# Patient Record
Sex: Male | Born: 1960 | Race: White | Hispanic: No | Marital: Single | Smoking: Never smoker
Health system: Southern US, Community
[De-identification: ages and names within clinical notes are randomized; demographics above are authoritative.]

## PROBLEM LIST (undated history)

## (undated) DIAGNOSIS — F419 Anxiety disorder, unspecified: Secondary | ICD-10-CM

## (undated) HISTORY — DX: Anxiety disorder, unspecified: F41.9

## (undated) HISTORY — PX: ELBOW SURGERY: SHX618

---

## 2006-11-08 ENCOUNTER — Ambulatory Visit (HOSPITAL_BASED_OUTPATIENT_CLINIC_OR_DEPARTMENT_OTHER): Admission: RE | Admit: 2006-11-08 | Discharge: 2006-11-08 | Payer: Self-pay | Admitting: Otolaryngology

## 2006-11-20 ENCOUNTER — Ambulatory Visit: Payer: Self-pay | Admitting: Internal Medicine

## 2006-12-30 ENCOUNTER — Ambulatory Visit: Payer: Self-pay | Admitting: Internal Medicine

## 2007-01-20 ENCOUNTER — Ambulatory Visit: Payer: Self-pay | Admitting: Internal Medicine

## 2007-07-19 ENCOUNTER — Ambulatory Visit: Payer: Self-pay | Admitting: Internal Medicine

## 2008-07-26 ENCOUNTER — Ambulatory Visit: Payer: Self-pay | Admitting: Internal Medicine

## 2008-07-26 DIAGNOSIS — G4730 Sleep apnea, unspecified: Secondary | ICD-10-CM | POA: Insufficient documentation

## 2008-07-26 DIAGNOSIS — J309 Allergic rhinitis, unspecified: Secondary | ICD-10-CM | POA: Insufficient documentation

## 2008-08-08 ENCOUNTER — Encounter: Admission: RE | Admit: 2008-08-08 | Discharge: 2008-08-08 | Payer: Self-pay | Admitting: Orthopedic Surgery

## 2008-08-14 ENCOUNTER — Encounter: Payer: Self-pay | Admitting: Internal Medicine

## 2009-05-27 ENCOUNTER — Emergency Department (HOSPITAL_COMMUNITY): Admission: EM | Admit: 2009-05-27 | Discharge: 2009-05-27 | Payer: Self-pay | Admitting: Emergency Medicine

## 2009-11-14 IMAGING — CR DG CHEST 2V
3 series · 3 of 3 positions shown · non-contrast
Comparison: None

CLINICAL DATA: Back pain

CHEST - 2 VIEW

[view not recorded (1 of 3)]
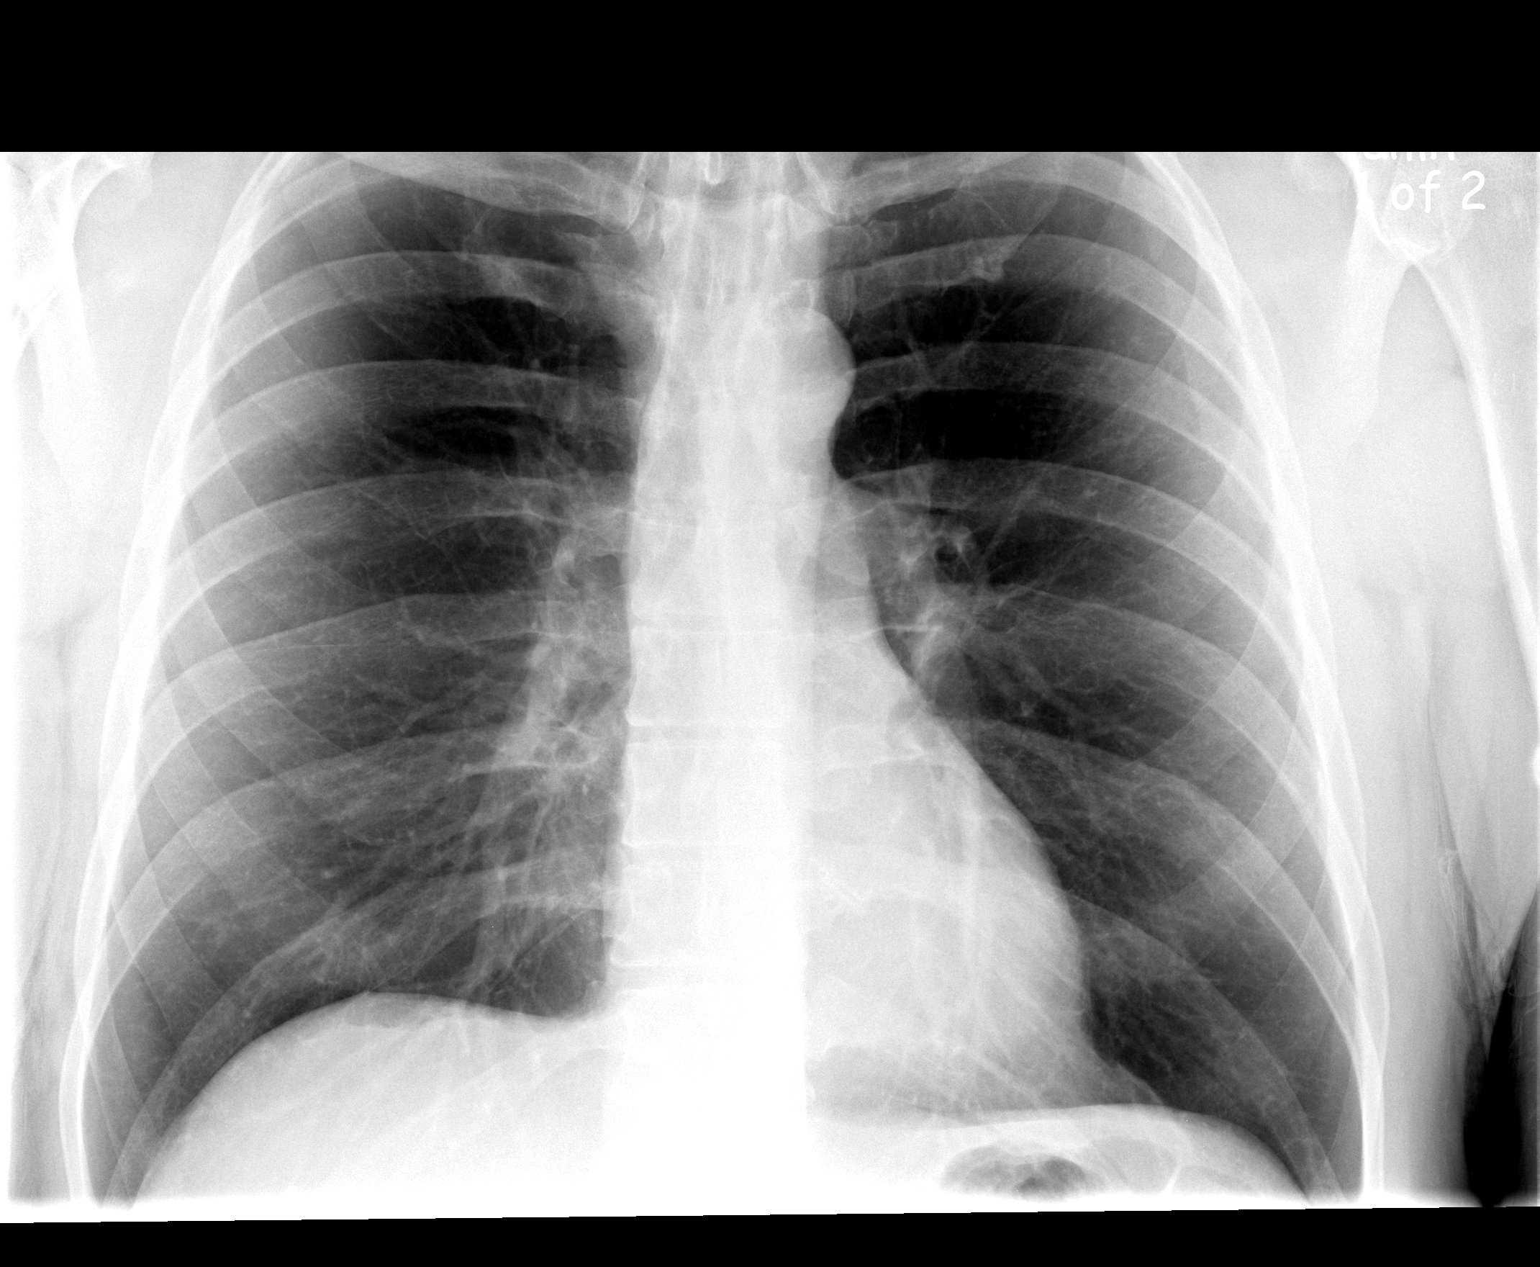

[view not recorded (2 of 3)]
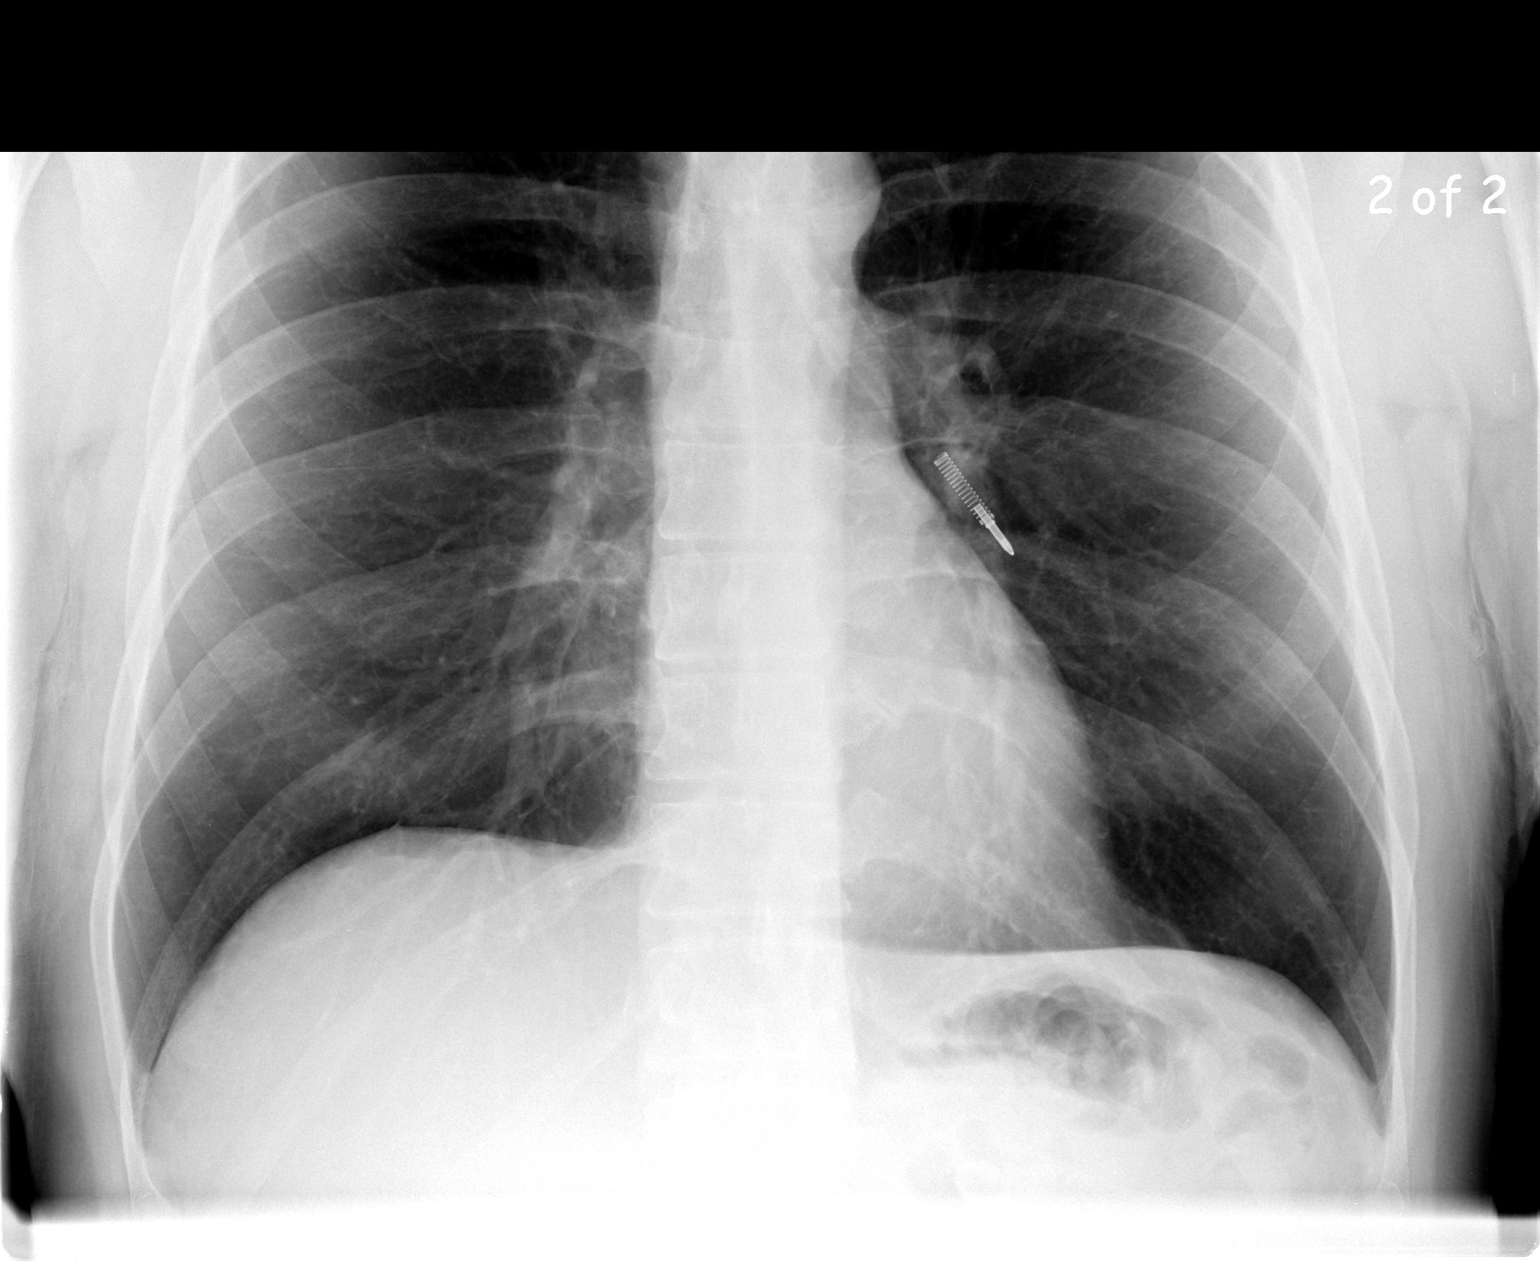

[view not recorded (3 of 3)]
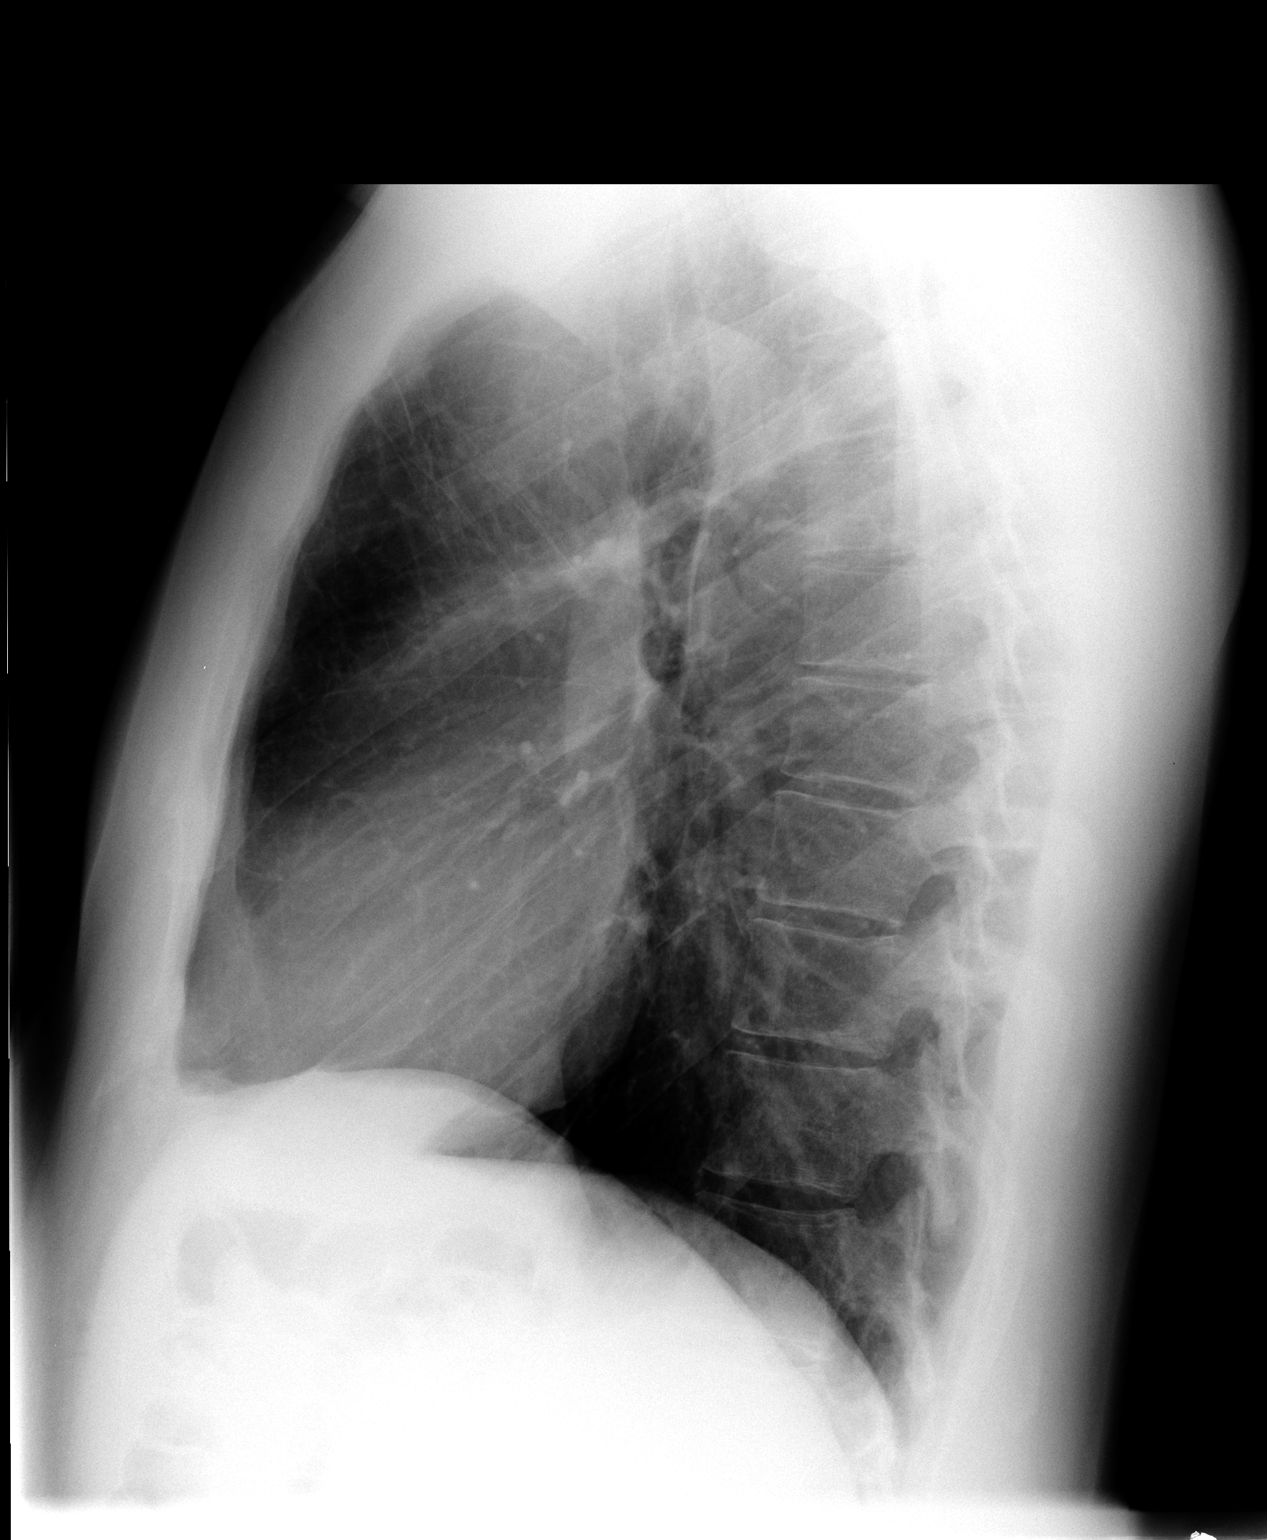

[3 of 3 positions shown; findings below may reference images not displayed]

FINDINGS: The heart size and mediastinal contours are within
normal limits.  Both lungs are clear.  The visualized skeletal
structures are unremarkable.
IMPRESSION: No active cardiopulmonary disease.

## 2011-02-28 LAB — POCT URINALYSIS DIP (DEVICE)
Bilirubin Urine: NEGATIVE
Glucose, UA: NEGATIVE mg/dL
Ketones, ur: NEGATIVE mg/dL

## 2011-04-06 NOTE — Assessment & Plan Note (Signed)
[facility]                             PULMONARY OFFICE NOTE   NAME:[name] [name] SCHINKE                         MRN:          [id number]  DATE:07/19/2007                            DOB:          [birth year]    PROBLEMS:  1. Obstructive sleep apnea.  2. Allergic rhinitis.   HISTORY OF PRESENT ILLNESS:  He is very pleased with auto titration  CPAP.  I have not gotten the download report, but he does not want to  change.  There has been no increase in nasal congestion which he says is  chronic.  Nasal sprays do help, but he dropped off of Nasacort.  No  seasonal concern now.  He is satisfied with his current status.   MEDICATIONS:  1. Paxil 30 mg.  2. Xanax 1 mg.  3. CPAP auto titration.  4. Benadryl p.r.n., used occasionally, we have discussed nonsedating      alternatives.   ALLERGIES:  INTOLERANCE TO PENICILLIN.   OBJECTIVE:  Weight 238 pounds, blood pressure 130/80, pulse 83, room air  saturation 96%, mild nasal stuffiness, periorbital edema, turbinate  edema, clear nasal secretion.  Chest clear.   IMPRESSION:  Rhinitis and some septal deviation which he says do not  bother him.  Obstructive sleep apnea, well controlled.   PLAN:  1. Continue CPAP by auto titration at his preference.  2. He will follow up with ENT p.r.n. having had seen Dr.  Annalee [name] in      the past.  Recognizing that mechanical obstruction by his septal      deviation is part of the problem.  There is also an allergic      rhinitis by appearance, but he does not want to do anything more      about it.  I have offered nasal steroid sprays.  Schedule return in      one year, earlier p.r.n.     Clinton D. Maple Hudson, MD, Tonny Bollman, FACP  Electronically Signed    CDY/MedQ  DD: 07/24/2007  DT: 07/24/2007  Job #: [id number]   cc:   Holley Bouche, M.D.

## 2011-04-09 NOTE — Assessment & Plan Note (Signed)
[facility]                             PULMONARY OFFICE NOTE   NAME:PAGEDominick, Morella                         MRN:          [id number]  DATE:01/20/2007                            DOB:          March 12, 1961    PROBLEMS:  1. Obstructive sleep apnea.  2. Allergic rhinitis.   HISTORY:  He has been on auto-titration CPAP, stating he is definitely  sleeping better and going in the right direction.  He has no history  of seasonal rhinitis, but says recent aggravation of nasal congestion  has remained much better after a Depo-Medrol injection.   MEDICATIONS:  1. Paxil 30 mg.  2. Xanax 1 mg.  3. CPAP.  4. He had been using some Benadryl p.r.n.   DRUG INTOLERANCES:  PENICILLIN.   OBJECTIVE:  Weight 232 pounds, BP 130/72, pulse regular 69, room air  saturation 99%.  Mild nasal stuffiness is apparent on listening to him talk.  There is  bilateral periorbital edema with no conjunctival injection.  He has a  pressure mark on the bridge of his nose from his mask.  LUNGS:  Clear.  Chest normal.   IMPRESSION:  1. Obstructive sleep apnea, should respond nicely to continuous      positive airway pressure.  He is noticing a significant improvement      in the quality of his sleep and sense of daytime tiredness.  2. There is a persistent rhinitis.  I am not sure he has felt much      better than this to recognize what might be normal.   PLAN:  1. We are going to refill and continue Nasacort.  2. We will convert his CPAP to fixed pressure.  3. Schedule return in 4 months, but earlier p.r.n.     Clinton D. Maple Hudson, MD, Tonny Bollman, FACP  Electronically Signed   CDY/MedQ  DD: 01/21/2007  DT: 01/21/2007  Job #: [id number]   cc:   Holley Bouche, M.D.  Kinnie Scales. Annalee Genta, M.D.

## 2011-04-09 NOTE — Assessment & Plan Note (Signed)
[facility]                             PULMONARY OFFICE NOTE   NAME:[name], [name]                         MRN:          [id number]  DATE:12/30/2006                            DOB:          1961-06-10    PROBLEM:  Sleep medicine consultation at the kind request of Dr.  Annalee [name] for this 50 year old man with sleep apnea.   HISTORY:  He has been concerned about non-restorative sleep, needing  naps most afternoons.  For years he had been waking as tired as he went  to bed, and getting very drowsy if she sat quietly.  He has been told of  loud snoring.  Breathe Right Strips would help nasal airflow some.  Recently, he has experienced periorbital edema and sneezing, which are  new for him.  He had not thought that nasal congestion was contributing  to snoring in the past.  He has gained some weight.  A nocturnal  polysomnogram was done at the [facility] on November 08, 2006,  significant for difficulty maintaining sleep.  He had severe obstructive  apnea during limited sleep time with an index of 63 per hour non-  positional events, loud snoring and oxygen desaturation to 87%.  He has  discussed treatment options with Dr. Annalee [name], who recommended that he  be seen for my evaluation.  Bedtime is between 10:30 and 11, estimating  30 minutes sleep onset before getting up at 7 a.m.  He is not sure how  much he wakes up during the night.   REVIEW OF SYSTEMS:  Nasal congestion and periorbital edema only in the  past 3 weeks.  Benadryl seems to help.  A 20-pound weight gain in the  last year or 2.  He sleeps alone.  Occasional awareness of leg or body  jerks, choking at night, but not significant reflux.  No chest pain or  palpitation.  No daytime chest congestion, wheeze or cough.  No ankle  edema.  No headache.  Nothing purulent or bloody.   PAST HISTORY:  No ENT surgery including tonsils.  No prior allergy  history.  No surgeries.  He is unaware of any  medical problems.   SOCIAL HISTORY:  Not married.  No children.  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  Alcohol about 4 times per week.   FAMILY HISTORY:  Mother snores.  Nobody known to have sleep apnea or  lung disease.  A brother has allergy problems.   OBJECTIVE:  Tall man, 228 pounds.  BP 122/80.  Pulse regular at 62.  Room air saturation 99% on room air at rest.  He is alert.  There is bilateral periorbital edema without conjunctival injection.  Nasal congestion is evident during speech with marked turbinate edema,  clear, watery, bubbly secretion.  Mucosa is not erythematous.  LUNGS:  Clear.  HEART SOUNDS:  Regular without murmur.  Speech quality is normal.  Thyroid is not enlarged.   IMPRESSION:  1. Obstructive sleep apnea.  2. Rhinitis with question of rhinosinusitis.  It is unusual to have  onset of allergic rhinitis so abruptly, and I question whether he      has had some other basis for nasal congestion, but this process      seems much more recent than onset of his sleep complaints.   PLAN:  1. We have discussed the physiology and medical concerns of sleep      apnea, possible relation to nasal congestion and treatment options.  2. We are going to titrate him for CPAP trial.  3. He is given a Neo-Synephrine nasal inhalation treatment, Nasacort      AQ to start 2 sprays each nostril once daily.  4. Depo-Medrol 80 mg IM with steroid talk.  5. Schedule return in 3 weeks with question of whether he will need      more therapy addressing his nasal congestion, and we may need to re-      involve Dr. Annalee [name] if there is mechanical obstruction. I      appreciate the chance the meet him.     [name] D. Maple Hudson, MD, [name] [name], FACP  Electronically Signed    CDY/MedQ  DD: 12/31/2006  DT: 12/31/2006  Job #: [id number]   cc:   [name] [name]. [name] [name], M.D.  [name] [name], M.D.

## 2011-04-09 NOTE — Procedures (Signed)
NAME:  PAGECher, [name] [name]                  ACCOUNT NO.:  [id number]   MEDICAL RECORD NO.:  [id number]          PATIENT TYPE:  OUT   LOCATION:  SLEEP CENTER                 FACILITY[facility]  [facility]   PHYSICIAN:  Clinton D. Maple Hudson, MD, FCCP, FACPDATE OF BIRTH:  10-[birth year]-63   DATE OF STUDY:  11/08/2006                            NOCTURNAL POLYSOMNOGRAM   INDICATION FOR STUDY:  Hypersomnia with sleep apnea.   EPWORTH SLEEPINESS SCORE:  8/24.   BMI:  27.9.   WEIGHT:  230 pounds.   HOME MEDICATION:  Paxil.   MEDICATIONS:   SLEEP ARCHITECTURE:  Very short total sleep time 91.5 minutes with sleep  efficiency 22%.  Sleep onset was delayed until 1:41 a.m.  Stage I was  21%, stage II 79% of total sleep time, stages III and IV, and REM were  absent.  Sleep latency 217 minutes.  Awake after sleep onset 114  minutes, reflecting minimal sleep after 3 a.m.  Arousal index 35.4.  No  bedtime medication was taken.  The patient reported that he had taken a  nap on the day of the study despite instruction otherwise.   RESPIRATORY DATA:  Apnea-hypopnea index (AHI, RDI) 63 obstructive events  per hour during the short total sleep time recorded.  There were 76  apneas and 20 hypopneas.  Events were not positional.  Split study  protocol could not be followed because of insufficient sleep time and  delay in sleep onset.   OXYGEN DATA:  Very severe snoring with oxygen desaturation to a nadir of  87%.  Mean oxygen saturation through the study was 94% on room air.   CARDIAC DATA:  Normal sinus rhythm with PVCs.   MOVEMENT-PARASOMNIA:  Occasional limb jerk without significant arousal.   IMPRESSIONS-RECOMMENDATIONS:  1. Abnormal sleep architecture with marked delay in sleep onset and      inability to sustain sleep through the night.  2. Severe obstructive sleep apnea/hypopnea syndrome was recorded      during the limited sleep time, with apnea-hypopnea index 63 per      hour, non-positional events, and very loud  snoring.  Oxygen      desaturation to a nadir of      87%.  3. Consider return for CPAP titration with an appropriate sleep      medication if __________.      Clinton D. Maple Hudson, MD, [facility], FACP  Diplomate, Biomedical engineer of Sleep Medicine  Electronically Signed     CDY/MEDQ  D:  11/20/2006 10:29:59  T:  11/20/2006 17:39:47  Job:  [id number]

## 2014-01-08 ENCOUNTER — Ambulatory Visit (INDEPENDENT_AMBULATORY_CARE_PROVIDER_SITE_OTHER): Payer: BC Managed Care – PPO | Admitting: Emergency Medicine

## 2014-01-08 VITALS — BP 130/78 | HR 78 | Temp 98.3°F | Resp 16 | Ht 75.25 in | Wt 229.2 lb

## 2014-01-08 DIAGNOSIS — K044 Acute apical periodontitis of pulpal origin: Secondary | ICD-10-CM

## 2014-01-08 DIAGNOSIS — K047 Periapical abscess without sinus: Secondary | ICD-10-CM

## 2014-01-08 MED ORDER — CLINDAMYCIN HCL 150 MG PO CAPS: 150.0000 mg | ORAL_CAPSULE | Freq: Three times a day (TID) | ORAL | Status: DC

## 2014-01-08 MED ORDER — CEPHALEXIN 500 MG PO CAPS: 500.0000 mg | ORAL_CAPSULE | Freq: Three times a day (TID) | ORAL | Status: DC

## 2014-01-08 MED ORDER — ACETAMINOPHEN-CODEINE #3 300-30 MG PO TABS: 1.0000 | ORAL_TABLET | ORAL | Status: DC | PRN

## 2014-01-08 NOTE — Patient Instructions (Signed)
°  Dental Abscess °A dental abscess is a collection of infected fluid (pus) from a bacterial infection in the inner part of the tooth (pulp). It usually occurs at the end of the tooth's root.  °CAUSES  °· Severe tooth decay. °· Trauma to the tooth that allows bacteria to enter into the pulp, such as a broken or chipped tooth. °SYMPTOMS  °· Severe pain in and around the infected tooth. °· Swelling and redness around the abscessed tooth or in the mouth or face. °· Tenderness. °· Pus drainage. °· Bad breath. °· Bitter taste in the mouth. °· Difficulty swallowing. °· Difficulty opening the mouth. °· Nausea. °· Vomiting. °· Chills. °· Swollen neck glands. °DIAGNOSIS  °· A medical and dental history will be taken. °· An examination will be performed by tapping on the abscessed tooth. °· X-rays may be taken of the tooth to identify the abscess. °TREATMENT °The goal of treatment is to eliminate the infection. You may be prescribed antibiotic medicine to stop the infection from spreading. A root canal may be performed to save the tooth. If the tooth cannot be saved, it may be pulled (extracted) and the abscess may be drained.  °HOME CARE INSTRUCTIONS °· Only take over-the-counter or prescription medicines for pain, fever, or discomfort as directed by your caregiver. °· Rinse your mouth (gargle) often with salt water (¼ tsp salt in 8 oz [250 ml] of warm water) to relieve pain or swelling. °· Do not drive after taking pain medicine (narcotics). °· Do not apply heat to the outside of your face. °· Return to your dentist for further treatment as directed. °SEEK MEDICAL CARE IF: °· Your pain is not helped by medicine. °· Your pain is getting worse instead of better. °SEEK IMMEDIATE MEDICAL CARE IF: °· You have a fever or persistent symptoms for more than 2 3 days. °· You have a fever and your symptoms suddenly get worse. °· You have chills or a very bad headache. °· You have problems breathing or swallowing. °· You have trouble  opening your mouth. °· You have swelling in the neck or around the eye. °Document Released: 11/08/2005 Document Revised: 08/02/2012 Document Reviewed: 02/16/2011 °[employer]® Patient Information ©2014 [employer]. ° ° °

## 2014-01-08 NOTE — Progress Notes (Signed)
[facility] [address], [place] [id number] [phone]  Date:  01/08/2014   Name:  [name] [name]   DOB:  05/19/1961   MRN:  [id number]  PCP:  Pcp Not In System    Chief Complaint: Tooth Infection   History of Present Illness:  [name] [name] is a 53 y.o. very pleasant male patient who presents with the following:  Says a tooth lost a filling two weeks ago and now has pain in that tooth and was unable to see a dentist today.  No fever or chills or facial swelling.  Thermal sensitive No improvement with over the counter medications or other home remedies. Denies other complaint or health concern today.   Patient Active Problem List   Diagnosis Date Noted  . ALLERGIC RHINITIS 07/26/2008  . SLEEP APNEA 07/26/2008    Past Medical History  Diagnosis Date  . Anxiety     Past Surgical History  Procedure Laterality Date  . Elbow surgery      History  Substance Use Topics  . Smoking status: Never Smoker   . Smokeless tobacco: Not on file  . Alcohol Use: Yes    Family History  Problem Relation Age of Onset  . Hyperlipidemia Mother   . Hyperlipidemia Father   . Heart disease Father   . Cancer Father     Allergies  Allergen Reactions  . Penicillins     REACTION: hives,redness    Medication list has been reviewed and updated.  No current outpatient prescriptions on file prior to visit.   No current facility-administered medications on file prior to visit.    Review of Systems:  As per HPI, otherwise negative.    Physical Examination: Filed Vitals:   01/08/14 1637  BP: 130/78  Pulse: 78  Temp: 98.3 F (36.8 C)  Resp: 16   Filed Vitals:   01/08/14 1637  Height: 6' 3.25" (1.911 m)  Weight: 229 lb 3.2 oz (103.964 kg)   Body mass index is 28.47 kg/(m^2). Ideal Body Weight: Weight in (lb) to have BMI = 25: 200.9   GEN: WDWN, NAD, Non-toxic, Alert & Oriented x 3 HEENT: Atraumatic, Normocephalic.  Ears and Nose: No external  deformity. EXTR: No clubbing/cyanosis/edema NEURO: Normal gait.  PSYCH: Normally interactive. Conversant. Not depressed or anxious appearing.  Calm demeanor.  MOUTH:  Lost filling #15.  Tender. No abscess  Assessment and Plan: Dental infection Keflex Clindamycin Tylenol #3 Dentist  Signed,  Phillips OdorJeffery Vicci Reder, MD

## 2014-06-06 ENCOUNTER — Ambulatory Visit (INDEPENDENT_AMBULATORY_CARE_PROVIDER_SITE_OTHER): Payer: BC Managed Care – PPO | Admitting: Family Medicine

## 2014-06-06 VITALS — BP 131/87 | HR 88 | Temp 98.6°F | Resp 16

## 2014-06-06 DIAGNOSIS — M109 Gout, unspecified: Secondary | ICD-10-CM

## 2014-06-06 LAB — CBC WITH DIFFERENTIAL/PLATELET
BASOS ABS: 0.1 10*3/uL (ref 0.0–0.1)
Basophils Relative: 1 % (ref 0–1)
EOS ABS: 0.2 10*3/uL (ref 0.0–0.7)
EOS PCT: 3 % (ref 0–5)
HEMATOCRIT: 43.5 % (ref 39.0–52.0)
Hemoglobin: 15.6 g/dL (ref 13.0–17.0)
LYMPHS PCT: 20 % (ref 12–46)
Lymphs Abs: 1.4 10*3/uL (ref 0.7–4.0)
MCH: 30.9 pg (ref 26.0–34.0)
MCHC: 35.9 g/dL (ref 30.0–36.0)
MCV: 86.1 fL (ref 78.0–100.0)
MONO ABS: 0.7 10*3/uL (ref 0.1–1.0)
Monocytes Relative: 10 % (ref 3–12)
Neutro Abs: 4.6 10*3/uL (ref 1.7–7.7)
Neutrophils Relative %: 66 % (ref 43–77)
PLATELETS: 169 10*3/uL (ref 150–400)
RBC: 5.05 MIL/uL (ref 4.22–5.81)
RDW: 12.9 % (ref 11.5–15.5)
WBC: 7 10*3/uL (ref 4.0–10.5)

## 2014-06-06 LAB — URIC ACID: Uric Acid, Serum: 6.3 mg/dL (ref 4.0–7.8)

## 2014-06-06 LAB — POCT SEDIMENTATION RATE: POCT SED RATE: 29 mm/hr — AB (ref 0–22)

## 2014-06-06 MED ORDER — METHYLPREDNISOLONE SODIUM SUCC 125 MG IJ SOLR
125.0000 mg | Freq: Once | INTRAMUSCULAR | Status: AC
  Administered 2014-06-06: 125 mg via INTRAMUSCULAR

## 2014-06-06 MED ORDER — OXYCODONE-ACETAMINOPHEN 5-325 MG PO TABS: 1.0000 | ORAL_TABLET | Freq: Three times a day (TID) | ORAL | Status: AC | PRN

## 2014-06-06 MED ORDER — KETOROLAC TROMETHAMINE 60 MG/2ML IM SOLN
60.0000 mg | Freq: Once | INTRAMUSCULAR | Status: AC
  Administered 2014-06-06: 60 mg via INTRAMUSCULAR

## 2014-06-06 MED ORDER — PREDNISONE 10 MG PO TABS: ORAL_TABLET | ORAL | Status: DC

## 2014-06-06 MED ORDER — COLCHICINE 0.6 MG PO TABS: ORAL_TABLET | ORAL | Status: DC

## 2014-06-06 NOTE — Patient Instructions (Signed)
Gout Gout is an inflammatory arthritis caused by a buildup of uric acid crystals in the joints. Uric acid is a chemical that is normally present in the blood. When the level of uric acid in the blood is too high it can form crystals that deposit in your joints and tissues. This causes joint redness, soreness, and swelling (inflammation). Repeat attacks are common. Over time, uric acid crystals can form into masses (tophi) near a joint, destroying bone and causing disfigurement. Gout is treatable and often preventable. CAUSES  The disease begins with elevated levels of uric acid in the blood. Uric acid is produced by your body when it breaks down a naturally found substance called purines. Certain foods you eat, such as meats and fish, contain high amounts of purines. Causes of an elevated uric acid level include:  Being passed down from parent to child (heredity).  Diseases that cause increased uric acid production (such as obesity, psoriasis, and certain cancers).  Excessive alcohol use.  Diet, especially diets rich in meat and seafood.  Medicines, including certain cancer-fighting medicines (chemotherapy), water pills (diuretics), and aspirin.  Chronic kidney disease. The kidneys are no longer able to remove uric acid well.  Problems with metabolism. Conditions strongly associated with gout include:  Obesity.  High blood pressure.  High cholesterol.  Diabetes. Not everyone with elevated uric acid levels gets gout. It is not understood why some people get gout and others do not. Surgery, joint injury, and eating too much of certain foods are some of the factors that can lead to gout attacks. SYMPTOMS   An attack of gout comes on quickly. It causes intense pain with redness, swelling, and warmth in a joint.  Fever can occur.  Often, only one joint is involved. Certain joints are more commonly involved:  Base of the big toe.  Knee.  Ankle.  Wrist.  Finger. Without  treatment, an attack usually goes away in a few days to weeks. Between attacks, you usually will not have symptoms, which is different from many other forms of arthritis. DIAGNOSIS  Your caregiver will suspect gout based on your symptoms and exam. In some cases, tests may be recommended. The tests may include:  Blood tests.  Urine tests.  X-rays.  Joint fluid exam. This exam requires a needle to remove fluid from the joint (arthrocentesis). Using a microscope, gout is confirmed when uric acid crystals are seen in the joint fluid. TREATMENT  There are two phases to gout treatment: treating the sudden onset (acute) attack and preventing attacks (prophylaxis).  Treatment of an Acute Attack.  Medicines are used. These include anti-inflammatory medicines or steroid medicines.  An injection of steroid medicine into the affected joint is sometimes necessary.  The painful joint is rested. Movement can worsen the arthritis.  You may use warm or cold treatments on painful joints, depending which works best for you.  Treatment to Prevent Attacks.  If you suffer from frequent gout attacks, your caregiver may advise preventive medicine. These medicines are started after the acute attack subsides. These medicines either help your kidneys eliminate uric acid from your body or decrease your uric acid production. You may need to stay on these medicines for a very long time.  The early phase of treatment with preventive medicine can be associated with an increase in acute gout attacks. For this reason, during the first few months of treatment, your caregiver may also advise you to take medicines usually used for acute gout treatment. Be sure you   understand your caregiver's directions. Your caregiver may make several adjustments to your medicine dose before these medicines are effective.  Discuss dietary treatment with your caregiver or dietitian. Alcohol and drinks high in sugar and fructose and foods  such as meat, poultry, and seafood can increase uric acid levels. Your caregiver or dietician can advise you on drinks and foods that should be limited. HOME CARE INSTRUCTIONS   Do not take aspirin to relieve pain. This raises uric acid levels.  Only take over-the-counter or prescription medicines for pain, discomfort, or fever as directed by your caregiver.  Rest the joint as much as possible. When in bed, keep sheets and blankets off painful areas.  Keep the affected joint raised (elevated).  Apply warm or cold treatments to painful joints. Use of warm or cold treatments depends on which works best for you.  Use crutches if the painful joint is in your leg.  Drink enough fluids to keep your urine clear or pale yellow. This helps your body get rid of uric acid. Limit alcohol, sugary drinks, and fructose drinks.  Follow your dietary instructions. Pay careful attention to the amount of protein you eat. Your daily diet should emphasize fruits, vegetables, whole grains, and fat-free or low-fat milk products. Discuss the use of coffee, vitamin C, and cherries with your caregiver or dietician. These may be helpful in lowering uric acid levels.  Maintain a healthy body weight. SEEK MEDICAL CARE IF:   You develop diarrhea, vomiting, or any side effects from medicines.  You do not feel better in 24 hours, or you are getting worse. SEEK IMMEDIATE MEDICAL CARE IF:   Your joint becomes suddenly more tender, and you have chills or a fever. MAKE SURE YOU:   Understand these instructions.  Will watch your condition.  Will get help right away if you are not doing well or get worse. Document Released: 11/05/2000 Document Revised: 03/05/2013 Document Reviewed: 06/21/2012 [employer] Patient Information 2015 [employer]. This information is not intended to replace advice given to you by your health care provider. Make sure you discuss any questions you have with your health care  provider. Information for patients with Gout  Gout defined-Gout occurs when urate crystals accumulate in your joint causing the inflammation and intense pain of gout attack.  Urate crystals can form when you have high levels of uric acid in your blood.  Your body produces uric acid when it breaks down prurines-substances that are found naturally in your body, as well as in certain foods such as organ meats, anchioves, herring, asparagus, and mushrooms.  Normally uric acid dissolves in your blood and passes through your kidneys into your urine.  But sometimes your body either produces too much uric acid or your kidneys excrete too little uric acid.  When this happens, uric acid can build up, forming sharp needle-like urate crystals in a joint or surrounding tissue that cause pain, inflammation and swelling.    Gout is characterized by sudden, severe attacks of pain, redness and tenderness in joints, often the joint at the base of the big toe.  Gout is complex form of arthritis that can affect anyone.  Men are more likely to get gout but women become increasingly more susceptible to gout after menopause.  An acute attack of gout can wake you up in the middle of the night with the sensation that your big toe is on fire.  The affected joint is hot, swollen and so tender that even the weight or the   sheet on it may seem intolerable.  If you experience symptoms of an acute gout attack it is important to your doctor as soon as the symptoms start.  Gout that goes untreated can lead to worsening pain and joint damage.  Risk Factors:  You are more likely to develop gout if you have high levels of uric acid in your body.    Factors that increase the uric acid level in your body include:  Lifestyle factors.  Excessive alcohol use-generally more than two drinks a day for men and more than one for women increase the risk of gout.  Medical conditions.  Certain conditions make it more likely that you will  develop gout.  These include hypertension, and chronic conditions such as diabetes, high levels of fat and cholesterol in the blood, and narrowing of the arteries.  Certain medications.  The uses of Thiazide diuretics- commonly used to treat hypertension and low dose aspirin can also increase uric acid levels.  Family history of gout.  If other members of your family have had gout, you are more likely to develop the disease.  Age and sex. Gout occurs more often in men than it does in women, primarily because women tend to have lower uric acid levels than men do.  Men are more likely to develop gout earlier usually between the ages of 40-50- whereas women generally develop signs and symptoms after menopause.    Tests and diagnosis:  Tests to help diagnose gout may include:  Blood test.  Your doctor may recommend a blood test to measure the uric acid level in your blood .  Blood tests can be misleading, though.  Some people have high uric acid levels but never experience gout.  And some people have signs and symptoms of gout, but don't have unusual levels of uric acid in their blood.  Joint fluid test.  Your doctor may use a needle to draw fluid from your affected joint.  When examined under the microscope, your joint fluid may reveal urate crystals.  Treatment:  Treatment for gout usually involves medications.  What medications you and your doctor choose will be based on your current health and other medications you currently take.  Gout medications can be used to treat acute gout attacks and prevent future attacks as well as reduce your risk of complications from gout such as the development of tophi from urate crystal deposits.  Alternative medicine:   Certain foods have been studied for their potential to lower uric acid levels, including:  Coffee.  Studies have found an association between coffee drinking (regular and decaf) and lower uric acid levels.  The evidence is not enough to  encourage non-coffee drinkers to start, but it may give clues to new ways of treating gout in the future.  Vitamin C.  Supplements containing vitamin C may reduce the levels of uric acid in your blood.  However, vitamin as a treatment for gout. Don't assume that if a little vitamin C is good, than lots is better.  Megadoses of vitamin C may increase your bodies uric acid levels.  Cherries.  Cherries have been associated with lower levels of uric acid in studies, but it isn't clear if they have any effect on gout signs and symptoms.  Eating more cherries and other dar-colored fruits, such as blackberries, blueberries, purple grapes and raspberries, may be a safe way to support your gout treatment.    Lifestyle/Diet Recommendations:   Drink 8 to 16 cups ( about 2   to 4 liters) of fluid each day, with at least half being water.  Avoid alcohol  Eat a moderate amount of protein, preferably from healthy sources, such as low-fat or fat-free dairy, tofu, eggs, and nut butters.  Limit you daily intake of meat, fish, and poultry to 4 to 6 ounces.  Avoid high fat meats and desserts.  Decrease you intake of shellfish, beef, lamb, pork, eggs and cheese.  Choose a good source of vitamin C daily such as citrus fruits, strawberries, broccoli,  brussel sprouts, papaya, and cantaloupe.   Choose a good source of vitamin A every other day such as yellow fruits, or dark green/yellow vegetables.  Avoid drastic weigh reduction or fasting.  If weigh loss is desired lose it over a period of several months.  See "dietary considerations.." chart for specific food recommendations.  Dietary Considerations for people with Gout  Food with negligible purine content (0-15 mg of purine nitrogen per 100 grams food)  May use as desired except on calorie variations  Non fat milk Cocoa Cereals (except in list II) Hard candies  Buttermilk Carbonated drinks Vegetables (except in list II) Sherbet  Coffee Fruits Sugar Honey   Tea Cottage Cheese Gelatin-jell-o Salt  Fruit juice Breads Angel food Cake   Herbs/spices Jams/Jellies Carnation Instant Breakfast    Foods that do not contain excessive purine content, but must be limited due to fat content  Cream Eggs Oil and Salad Dressing  Half and Half Peanut Butter Chocolate  Whole Milk Cakes Potato Chips  Butter Ice Cream Fried Foods  Cheese Nuts Waffles, pancakes   List II: Food with moderate purine content (50-150 mg of purine nitrogen per 100 grams of food)  Limit total amount each day to 5 oz. cooked Lean meat, other than those on list III   Poultry, other than those on list III Fish, other than those on list III   Seafood, other than those on list III  These foods may be used occasionally  Peas Lentils Bran  Spinach Oatmeal Dried Beans and Peas  Asparagus Wheat Germ Mushrooms   Additional information about meat choices  Choose fish and poultry, particularly without skin, often.  Select lean, well trimmed cuts of meat.  Avoid all fatty meats, bacon , sausage, fried meats, fried fish, or poultry, luncheon meats, cold cuts, hot dogs, meats canned or frozen in gravy, spareribs and frozen and packaged prepared meats.   List III: Foods with HIGH purine content / Foods to AVOID (150-800 mg of purine nitrogen per 100 grams of food)  Anchovies Herring Meat Broths  Liver Mackerel Meat Extracts  Kidney Scallops Meat Drippings  Sardines Wild Game Mincemeat  Sweetbreads Goose Gravy  Heart Tongue Yeast, baker's and brewers   Commercial soups made with any of the foods listed in List II or List III  In addition avoid all alcoholic beverages 

## 2014-06-06 NOTE — Progress Notes (Signed)
Subjective:  This chart was scribed for Norberto SorensonEva Kent Riendeau, MD by Charline BillsEssence Howell, ED Scribe. The patient was seen in room 4. Patient's care was started at 10:14 AM.   Patient ID: [name] GitelmanJames H [name], male    DOB: 08/12/1961, 53 y.o.   MRN: [id number]  Chief Complaint  Patient presents with  . Gout    lt ankle gout attack x 3 days   HPI HPI Comments: [name] [name] is a 53 y.o. male who presents to the [facility] complaining of constant R ankle pain onset 3 days ago. He describes the quality of pain as throbbing. Pt reports associated swelling to the area. He states that he has slept with his foot off the bed for days since he can not tolerate bed sheets. He denies injury. Pt suspects gout and reports first episode 3 months ago in L toe. Pt has a family h/o gout. He has placed his foot in ice water, eaten cherries and taken Advil with temporary relief. Pt reports a diet that consists largely of beer and beef.   Past Medical History  Diagnosis Date  . Anxiety    Current Outpatient Prescriptions on File Prior to Visit  Medication Sig Dispense Refill  . PARoxetine (PAXIL) 20 MG tablet Take 20 mg by mouth daily.       No current facility-administered medications on file prior to visit.   Allergies  Allergen Reactions  . Penicillins     REACTION: hives,redness   Review of Systems  Constitutional: Negative for fever, chills, diaphoresis and fatigue.  Eyes: Negative for visual disturbance.  Respiratory: Negative for cough, chest tightness, shortness of breath and wheezing.   Cardiovascular: Negative for chest pain and palpitations.  Gastrointestinal: Negative for nausea, vomiting, abdominal pain and diarrhea.  Musculoskeletal: Positive for arthralgias and joint swelling. Negative for neck pain and neck stiffness.  Skin: Negative for rash.  Neurological: Negative for dizziness, syncope, weakness, light-headedness, numbness and headaches.   Triage Vitals: BP 131/87  Pulse 88   Temp(Src) 98.6 F (37 C) (Oral)  Resp 16  SpO2 98%    Objective:   Physical Exam  Nursing note and vitals reviewed. Constitutional: He is oriented to person, place, and time. He appears well-developed and well-nourished.  HENT:  Head: Normocephalic and atraumatic.  Eyes: Conjunctivae and EOM are normal.  Neck: Neck supple.  Cardiovascular:  Pulses:      Dorsalis pedis pulses are 2+ on the right side, and 2+ on the left side.       Posterior tibial pulses are 2+ on the right side, and 2+ on the left side.  Musculoskeletal:       Right ankle: He exhibits decreased range of motion and swelling.  R ankle: Erythema Warmth Significant hypesthesia  Neurological: He is alert and oriented to person, place, and time.  Skin: Skin is warm and dry.  Psychiatric: He has a normal mood and affect. His behavior is normal.      Assessment & Plan:   Acute gout of right ankle, unspecified cause - Plan: Uric acid, CBC with Differential, POCT SEDIMENTATION RATE, ketorolac (TORADOL) injection 60 mg, methylPREDNISolone sodium succinate (SOLU-MEDROL) 125 mg/2 mL injection 125 mg  Meds ordered this encounter  Medications  . predniSONE (DELTASONE) 10 MG tablet    Sig: 6-5-4-3-2-1 tabs po qd x 6d taper    Dispense:  21 tablet    Refill:  0  . oxyCODONE-acetaminophen (ROXICET) 5-325 MG per tablet  Sig: Take 1 tablet by mouth every 8 (eight) hours as needed for severe pain.    Dispense:  20 tablet    Refill:  0  . colchicine 0.6 MG tablet    Sig: 2 tab po x 1 at onset of gout flair, then 1 tab 1 hr later    Dispense:  6 tablet    Refill:  2  . ketorolac (TORADOL) injection 60 mg    Sig:   . methylPREDNISolone sodium succinate (SOLU-MEDROL) 125 mg/2 mL injection 125 mg    Sig:     I personally performed the services described in this documentation, which was scribed in my presence. The recorded information has been reviewed and considered, and addended by me as needed.  Norberto Sorenson, MD  [facility]

## 2014-06-07 ENCOUNTER — Encounter: Payer: Self-pay | Admitting: Family Medicine

## 2014-10-30 ENCOUNTER — Ambulatory Visit (INDEPENDENT_AMBULATORY_CARE_PROVIDER_SITE_OTHER): Payer: BC Managed Care – PPO | Admitting: Family Medicine

## 2014-10-30 VITALS — BP 120/72 | HR 67 | Temp 98.3°F | Resp 18 | Ht 75.0 in | Wt 237.0 lb

## 2014-10-30 DIAGNOSIS — M10471 Other secondary gout, right ankle and foot: Secondary | ICD-10-CM

## 2014-10-30 DIAGNOSIS — M25571 Pain in right ankle and joints of right foot: Secondary | ICD-10-CM

## 2014-10-30 MED ORDER — PREDNISONE 10 MG PO TABS: ORAL_TABLET | ORAL | Status: AC

## 2014-10-30 MED ORDER — COLCHICINE 0.6 MG PO TABS: ORAL_TABLET | ORAL | Status: DC

## 2014-10-30 NOTE — Progress Notes (Signed)
Subjective: Patient had acute onset of pain in his right ankle last night. He has had gout in his left large toe not long ago. It resolved to colchicine and prednisone. He was hurting worse today and he came on in. Knows of no injury. His previous uric acid level was low. He did eat some shrimp several days ago.  Objective: Tender and swelling right ankle, especially lateral aspect. No erythema. Not very warm to touch. Hurts to pronate or supinate or flex the ankle.  Assessment: Acute gout and pain right ankle  Plan: Prednisone and colchicine Check labs in about a month. Sometimes during the acute flare the level will go down, so we will wait a little while to check it. Last time it was normal.

## 2014-10-30 NOTE — Patient Instructions (Signed)
Take colchicine 2 initially, then 1 twice daily for several days until it is well calmed down, then decrease to once daily for a week. Hold on to the remainder for future need.  Take the prednisone in a tapered dose fashion as noted, 6, 5, 4, 3, 2, 1. Best taken early in the day but I would take the first doses now when you get it filled.  Read the food lists on the Internet of types of food known to trigger gout. Take caution with intake of shellfish.  Return in about 3 or 4 weeks for blood work, lab only visit. Tell the people at the desk you are just here for your lab work to be drawn per my request.  Return as needed

## 2015-08-06 ENCOUNTER — Other Ambulatory Visit: Payer: Self-pay | Admitting: Family Medicine

## 2016-06-30 ENCOUNTER — Ambulatory Visit (INDEPENDENT_AMBULATORY_CARE_PROVIDER_SITE_OTHER): Payer: BLUE CROSS/BLUE SHIELD

## 2016-06-30 ENCOUNTER — Encounter: Payer: Self-pay | Admitting: Podiatry

## 2016-06-30 ENCOUNTER — Ambulatory Visit (INDEPENDENT_AMBULATORY_CARE_PROVIDER_SITE_OTHER): Payer: BLUE CROSS/BLUE SHIELD | Admitting: Podiatry

## 2016-06-30 VITALS — BP 153/90 | HR 75 | Resp 18

## 2016-06-30 DIAGNOSIS — M722 Plantar fascial fibromatosis: Secondary | ICD-10-CM | POA: Diagnosis not present

## 2016-06-30 DIAGNOSIS — R52 Pain, unspecified: Secondary | ICD-10-CM

## 2016-06-30 NOTE — Progress Notes (Signed)
   Subjective:    Patient ID: [name] [name], [name] [name]    DOB: 01/17/1961, 55 y.o.   MRN: [id number]  HPI    This patient presents today complaining of inferior and posterior left heel pain for approximately 5 weeks. The pain is especially uncomfortable we changes from a seated to a standing position and with prolonged standing and walking. Patient has changed shoes and has recently purchased good foot semirigid shoe inserts which has reduced some of the pain in the heel. He still describes the need to limp and walk at the end of his workday. 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   Review of Systems     Objective:   Physical Exam Orientated 3  Vascular: No calf edema or calf tenderness bilaterally DP pulses 2/4 right and 1/4 left PT pulses 2/4 bilaterally Capillary reflex immediate bilaterally   Neurological: Sensation to 10 g monofilament wire intact 5/5 bilaterally Vibratory sensation equal reactive bilaterally Ankle reflex equal and reactive bilaterally  Dermatological: No open skin lesions bilaterally  Musculoskeletal: Exquisite palpable tenderness medial plantar fascial insertional area left which duplicates area is primary pain source. There are no palpable lesions and fat pad is adequate Slight palpable tenderness posterior left heel without any palpable lesions Weightbearing: Patient able to heel off unilaterally and bilaterally   X-ray examination weightbearing left foot dated 06/30/2016  Intact bony structure without fracture and/or dislocation Bone density and joint spaces unremarkable in all 3 views Minimal periosteal irritation posterior left heel  Radiographic impression: No acute bony abnormality noted in the x-ray of the left foot dated 06/30/2016       Assessment & Plan:   Assessment: Satisfactory neurovascular status Plantar  fasciitis left Low-grade Achilles tendinitis left  Plan: I reviewed the results of x-ray and examination with patient today. I informed him that his primary pain, source was the plantar fasciitis left and we discussed treatment options for plantar fasciitis in detail. We discussed stretching, shoe modification medication, foot orthotics and offered him Kenalog injection. He verbally consents to the Kenalog injection  The skin is prepped with alcohol and Betadine and 10 mg of Kenalog mid with 10 mg of plain Xylocaine and 2.5 mg of plain Sensorcaine were injected inferior heel left for Kenalog injection #1. Patient tolerated procedure without any difficulty.  Shoeing and stretching discussed Continue wearing existing orthotic  Reappoint 6 weeks

## 2016-06-30 NOTE — Patient Instructions (Signed)

## 2016-08-11 ENCOUNTER — Ambulatory Visit: Payer: BLUE CROSS/BLUE SHIELD | Admitting: Podiatry
